# Patient Record
Sex: Male | Born: 1990 | Hispanic: Yes | Marital: Single | Smoking: Never smoker
Health system: Southern US, Community
[De-identification: ages and names within clinical notes are randomized; demographics above are authoritative.]

---

## 2017-05-05 ENCOUNTER — Emergency Department (HOSPITAL_BASED_OUTPATIENT_CLINIC_OR_DEPARTMENT_OTHER)
Admission: EM | Admit: 2017-05-05 | Discharge: 2017-05-05 | Disposition: A | Discharge status: Home / Self Care | Payer: Federal, State, Local not specified - PPO | Attending: Emergency Medicine | Admitting: Emergency Medicine

## 2017-05-05 ENCOUNTER — Emergency Department (HOSPITAL_BASED_OUTPATIENT_CLINIC_OR_DEPARTMENT_OTHER): Payer: Federal, State, Local not specified - PPO

## 2017-05-05 ENCOUNTER — Encounter (HOSPITAL_BASED_OUTPATIENT_CLINIC_OR_DEPARTMENT_OTHER): Payer: Self-pay

## 2017-05-05 DIAGNOSIS — Y929 Unspecified place or not applicable: Secondary | ICD-10-CM | POA: Diagnosis not present

## 2017-05-05 DIAGNOSIS — S99921A Unspecified injury of right foot, initial encounter: Secondary | ICD-10-CM | POA: Diagnosis present

## 2017-05-05 DIAGNOSIS — Y999 Unspecified external cause status: Secondary | ICD-10-CM | POA: Diagnosis not present

## 2017-05-05 DIAGNOSIS — X58XXXA Exposure to other specified factors, initial encounter: Secondary | ICD-10-CM | POA: Diagnosis not present

## 2017-05-05 DIAGNOSIS — S91311A Laceration without foreign body, right foot, initial encounter: Secondary | ICD-10-CM

## 2017-05-05 DIAGNOSIS — R55 Syncope and collapse: Secondary | ICD-10-CM | POA: Diagnosis not present

## 2017-05-05 DIAGNOSIS — Y939 Activity, unspecified: Secondary | ICD-10-CM | POA: Diagnosis not present

## 2017-05-05 DIAGNOSIS — Z23 Encounter for immunization: Secondary | ICD-10-CM | POA: Diagnosis not present

## 2017-05-05 LAB — COMPREHENSIVE METABOLIC PANEL
ALT: 25 U/L (ref 17–63)
ANION GAP: 10 (ref 5–15)
AST: 31 U/L (ref 15–41)
Albumin: 4.2 g/dL (ref 3.5–5.0)
Alkaline Phosphatase: 59 U/L (ref 38–126)
BILIRUBIN TOTAL: 2.2 mg/dL — AB (ref 0.3–1.2)
BUN: 12 mg/dL (ref 6–20)
CHLORIDE: 103 mmol/L (ref 101–111)
CO2: 22 mmol/L (ref 22–32)
Calcium: 9 mg/dL (ref 8.9–10.3)
Creatinine, Ser: 0.88 mg/dL (ref 0.61–1.24)
Glucose, Bld: 193 mg/dL — ABNORMAL HIGH (ref 65–99)
POTASSIUM: 3.3 mmol/L — AB (ref 3.5–5.1)
Sodium: 135 mmol/L (ref 135–145)
TOTAL PROTEIN: 6.7 g/dL (ref 6.5–8.1)

## 2017-05-05 LAB — CBC
HEMATOCRIT: 42.7 % (ref 39.0–52.0)
Hemoglobin: 15.4 g/dL (ref 13.0–17.0)
MCH: 29.5 pg (ref 26.0–34.0)
MCHC: 36.1 g/dL — ABNORMAL HIGH (ref 30.0–36.0)
MCV: 81.8 fL (ref 78.0–100.0)
PLATELETS: 199 10*3/uL (ref 150–400)
RBC: 5.22 MIL/uL (ref 4.22–5.81)
RDW: 12.6 % (ref 11.5–15.5)
WBC: 7.9 10*3/uL (ref 4.0–10.5)

## 2017-05-05 LAB — PROTIME-INR
INR: 1.08
Prothrombin Time: 13.9 seconds (ref 11.4–15.2)

## 2017-05-05 MED ORDER — LIDOCAINE HCL 1 % IJ SOLN
10.0000 mL | Freq: Once | INTRAMUSCULAR | Status: AC
  Administered 2017-05-05: 10 mL via INTRADERMAL

## 2017-05-05 MED ORDER — TETANUS-DIPHTH-ACELL PERTUSSIS 5-2.5-18.5 LF-MCG/0.5 IM SUSP
0.5000 mL | Freq: Once | INTRAMUSCULAR | Status: AC
  Administered 2017-05-05: 0.5 mL via INTRAMUSCULAR
  Filled 2017-05-05: qty 0.5

## 2017-05-05 MED ORDER — LIDOCAINE HCL (PF) 1 % IJ SOLN: 10.0000 mL | Freq: Once | INTRAMUSCULAR | Status: DC

## 2017-05-05 MED ORDER — SODIUM CHLORIDE 0.9 % IV BOLUS (SEPSIS)
1000.0000 mL | Freq: Once | INTRAVENOUS | Status: AC
  Administered 2017-05-05: 1000 mL via INTRAVENOUS

## 2017-05-05 MED ORDER — CLINDAMYCIN HCL 150 MG PO CAPS
450.0000 mg | ORAL_CAPSULE | Freq: Once | ORAL | Status: AC
  Administered 2017-05-05: 450 mg via ORAL
  Filled 2017-05-05: qty 3

## 2017-05-05 MED ORDER — FENTANYL CITRATE (PF) 100 MCG/2ML IJ SOLN
50.0000 ug | Freq: Once | INTRAMUSCULAR | Status: AC
  Administered 2017-05-05: 50 ug via INTRAVENOUS
  Filled 2017-05-05: qty 2

## 2017-05-05 MED ORDER — LIDOCAINE HCL 1 % IJ SOLN
INTRAMUSCULAR | Status: AC
  Administered 2017-05-05: 10 mL via INTRADERMAL
  Filled 2017-05-05: qty 10

## 2017-05-05 MED ORDER — CLINDAMYCIN HCL 150 MG PO CAPS: 450.0000 mg | ORAL_CAPSULE | Freq: Three times a day (TID) | ORAL | 0 refills | Status: AC

## 2017-05-05 NOTE — ED Notes (Signed)
ED Provider at bedside. ([place]) 

## 2017-05-05 NOTE — ED Provider Notes (Signed)
[facility] Provider Note   CSN: [id number] Arrival date & time: 05/05/17  1124     History   Chief Complaint Chief Complaint  Patient presents with  . Extremity Laceration    HPI Geoffrey Cox is a 26 y.o. male.  The history is provided by the patient and a relative. No language interpreter was used.  Laceration   The incident occurred less than 1 hour ago. The laceration is located on the right foot. The laceration is 6 cm in size. The laceration mechanism was a a metal edge. The pain is at a severity of 4/10. The pain is moderate. The pain has been constant since onset. He reports no foreign bodies present. His tetanus status is out of date.    History reviewed. No pertinent past medical history.  There are no active problems to display for this patient.   History reviewed. No pertinent surgical history.     Home Medications    Prior to Admission medications   Not on File    Family History No family history on file.  Social History Social History  Substance Use Topics  . Smoking status: Never Smoker  . Smokeless tobacco: Never Used  . Alcohol use No     Allergies   Penicillins   Review of Systems Review of Systems  Constitutional: Negative for activity change, chills, diaphoresis, fatigue and fever.  HENT: Negative for congestion and rhinorrhea.   Eyes: Negative for visual disturbance.  Respiratory: Negative for cough, chest tightness, shortness of breath, wheezing and stridor.   Cardiovascular: Negative for chest pain and palpitations.  Gastrointestinal: Negative for abdominal pain, nausea and vomiting.  Musculoskeletal: Negative for back pain and gait problem.  Skin: Positive for wound. Negative for rash.  Neurological: Negative for dizziness, weakness, light-headedness and headaches.  Psychiatric/Behavioral: Negative for agitation.  All other systems reviewed and are negative.    Physical Exam Updated Vital Signs BP 114/71    Pulse 67   Temp 98.3 F (36.8 C) (Oral)   Resp 18   Ht 6' (1.829 m)   Wt 81.6 kg (180 lb)   SpO2 100%   BMI 24.41 kg/m   Physical Exam  Constitutional: He is oriented to person, place, and time. He appears well-developed and well-nourished. No distress.  HENT:  Head: Normocephalic.  Mouth/Throat: Oropharynx is clear and moist. No oropharyngeal exudate.  Eyes: Pupils are equal, round, and reactive to light. Conjunctivae are normal.  Neck: Normal range of motion.  Cardiovascular: Regular rhythm and intact distal pulses.   No murmur heard. Pulmonary/Chest: Effort normal. No stridor. No respiratory distress. He exhibits no tenderness.  Abdominal: Soft. He exhibits no distension. There is no tenderness.  Musculoskeletal: He exhibits tenderness.       Right foot: There is tenderness and laceration. There is normal range of motion, no swelling, normal capillary refill and no deformity.       Feet:  6cm laceration on R foot. Normal sensation, cap refill, pulses, and ROM of R foot and ankle. Venous bleeding.   Neurological: He is alert and oriented to person, place, and time. No cranial nerve deficit or sensory deficit. He exhibits normal muscle tone.  Skin: Capillary refill takes less than 2 seconds. He is not diaphoretic. No erythema. No pallor.  Psychiatric: He has a normal mood and affect.  Nursing note and vitals reviewed.    ED Treatments / Results  Labs (all labs ordered are listed, but only abnormal results are displayed) Labs  Reviewed  COMPREHENSIVE METABOLIC PANEL - Abnormal; Notable for the following:       Result Value   Potassium 3.3 (*)    Glucose, Bld 193 (*)    Total Bilirubin 2.2 (*)    All other components within normal limits  CBC - Abnormal; Notable for the following:    MCHC 36.1 (*)    All other components within normal limits  PROTIME-INR    EKG  EKG Interpretation None       Radiology Dg Foot Complete Right  Result Date: 05/05/2017 CLINICAL  DATA:  Laceration on dorsal midfoot. EXAM: RIGHT FOOT COMPLETE - 3+ VIEW COMPARISON:  None. FINDINGS: There is no evidence of fracture or dislocation. There is no evidence of arthropathy or other focal bone abnormality. Soft tissue laceration of the dorsal foot at the level of the mid metatarsals. IMPRESSION: Dorsal forefoot soft tissue laceration. No acute osseous abnormality. Electronically Signed   By: Obie DredgeWilliam T Derry M.D.   On: 05/05/2017 12:25    Procedures .Marland Kitchen.Laceration Repair Date/Time: 05/05/2017 9:12 PM Performed by: Heide ScalesEGELER, Ranya Fiddler J Authorized by: Heide ScalesEGELER, Yosmar Ryker J   Consent:    Consent obtained:  Verbal   Consent given by:  Patient   Risks discussed:  Infection, pain, poor cosmetic result, poor wound healing and need for additional repair   Alternatives discussed:  No treatment Anesthesia (see MAR for exact dosages):    Anesthesia method:  Local infiltration   Local anesthetic:  Lidocaine 1% w/o epi Laceration details:    Location:  Foot   Foot location:  Top of R foot   Length (cm):  6   Depth (mm):  3 Repair type:    Repair type:  Intermediate Pre-procedure details:    Preparation:  Patient was prepped and draped in usual sterile fashion and imaging obtained to evaluate for foreign bodies Exploration:    Hemostasis achieved with:  Direct pressure   Wound exploration: wound explored through full range of motion and entire depth of wound probed and visualized     Wound extent: no foreign bodies/material noted, no muscle damage noted, no nerve damage noted, no tendon damage noted, no underlying fracture noted and no vascular damage noted     Contaminated: no   Treatment:    Area cleansed with:  Saline   Amount of cleaning:  Extensive   Irrigation solution:  Sterile saline   Irrigation volume:  2L   Irrigation method:  Syringe Skin repair:    Repair method:  Sutures   Suture size:  4-0   Suture material:  Prolene   Suture technique:  Horizontal mattress (and  simple interrupted. )   Number of sutures:  14 Approximation:    Approximation:  Close   Vermilion border: well-aligned   Post-procedure details:    Dressing:  Antibiotic ointment and non-adherent dressing   Patient tolerance of procedure:  Tolerated well, no immediate complications   (including critical care time)  Medications Ordered in ED Medications  sodium chloride 0.9 % bolus 1,000 mL (0 mLs Intravenous Stopped 05/05/17 1404)  Tdap (BOOSTRIX) injection 0.5 mL (0.5 mLs Intramuscular Given 05/05/17 1602)  fentaNYL (SUBLIMAZE) injection 50 mcg (50 mcg Intravenous Given 05/05/17 1608)  lidocaine (XYLOCAINE) 1 % (with pres) injection 10 mL (10 mLs Intradermal Given 05/05/17 1602)  clindamycin (CLEOCIN) capsule 450 mg (450 mg Oral Given 05/05/17 1723)     Initial Impression / Assessment and Plan / ED Course  I have reviewed the triage vital signs and the  nursing notes.  Pertinent labs & imaging results that were available during my care of the patient were reviewed by me and considered in my medical decision making (see chart for details).     Geoffrey Cox is a 26 y.o. male With no significant past medical history who presents with laceration. Patient reports that he was trying to move a heavy dresser when he got the leg of it caught on his right foot. He was not wearing shoes at the time. Patient had immediate laceration on right dorsal foot. Laceration approximately 6 cm. Patient had mild to moderate bleeding initially at home and wrapped wound in a towel and used a belt for tourniquet. Patient denied any loss of consciousness, lightheadedness, or other injuries.  On arrival, patient was hypotensive however, patient laid down and blood pressure improved over 100 systolic. Suspect a vagal hypotension in the setting of bleeding and laceration. Patient denies any lightheadedness or fatigue. Patient denies significant bleeding.  Shared conversation held with nursing, emergency team, in charge  and decision made not to activate a leveled trauma at this time due to minimal bleeding, improvement in blood pressure on recheck, and no systemic symptoms of significant traumatic injury.  Patient had traumatic bloodwork ordered showing no evidence of significant abnormality. No anemia. Slight hypokalemia.  No fractures seen on x-ray. No foreign body seen.  Patient was given some fluids and had no other hypotension aside from triage. Continued to suspect a vasovagal hypertension. Laceration was repaired as described above without difficulty. Patient had one covered in bacitracin. Clindamycin was given given the large laceration.  Patient had tetanus updated.  Patient discharged with prescription for clindamycin as he is loved penicillins. Patient will follow up with PCP and observe strict return precautions for infection or worsened pain. Patient has crutches at home and leaves a postop shoe to prevent significant foot flexion. Patient understood plan of care and was discharged in good condition.     Final Clinical Impressions(s) / ED Diagnoses   Final diagnoses:  Laceration of right foot, initial encounter    New Prescriptions Discharge Medication List as of 05/05/2017  5:20 PM    START taking these medications   Details  clindamycin (CLEOCIN) 150 MG capsule Take 3 capsules (450 mg total) by mouth 3 (three) times daily., Starting Thu 05/05/2017, Until Tue 05/10/2017, Print        Clinical Impression: 1. Laceration of right foot, initial encounter     Disposition: Discharge  Condition: Good  I have discussed the results, Dx and Tx plan with the pt(& family if present). He/she/they expressed understanding and agree(s) with the plan. Discharge instructions discussed at great length. Strict return precautions discussed and pt &/or family have verbalized understanding of the instructions. No further questions at time of discharge.    Discharge Medication List as of 05/05/2017  5:20  PM    START taking these medications   Details  clindamycin (CLEOCIN) 150 MG capsule Take 3 capsules (450 mg total) by mouth 3 (three) times daily., Starting Thu 05/05/2017, Until Tue 05/10/2017, Print        Follow Up: [facility] [address] [place] [address] [phone] Schedule an appointment as soon as possible for a visit    [facility] [place] [facility] [address] [phone] mc [address] [place] [address] [phone]  If symptoms worsen     Mikael Debell, Canary Brim, MD 05/05/17 2139

## 2017-05-05 NOTE — ED Notes (Signed)
Pt taken to tx room via w/c to lie down-pt denies feeling light headed-pt presented with leather belt tightened around right LE-removed

## 2017-05-05 NOTE — ED Triage Notes (Signed)
Pt states he cut top of right foot on a mirror just PTA-large lac noted-presents to triage in w/c-pale

## 2017-05-05 NOTE — ED Notes (Signed)
Suture cart at bedside 

## 2017-05-05 NOTE — Discharge Instructions (Signed)
Please take her antibiotics for the next 5 days to prevent infection. Please follow-up with a primary care physician in 7-10 days for suture removal. Please watch for signs and symptoms of infection such as redness, purulence, and streaking. Please use crutches. Please try not to bend the foot to add tension to the sutures. If symptoms change or worsen, please return to the nearest emergency department.

## 2018-08-18 IMAGING — DX DG FOOT COMPLETE 3+V*R*
3 series · 3 of 3 positions shown · non-contrast
Comparison: None.

CLINICAL DATA: Laceration on dorsal midfoot.

EXAM:
RIGHT FOOT COMPLETE - 3+ VIEW

[foot ap]
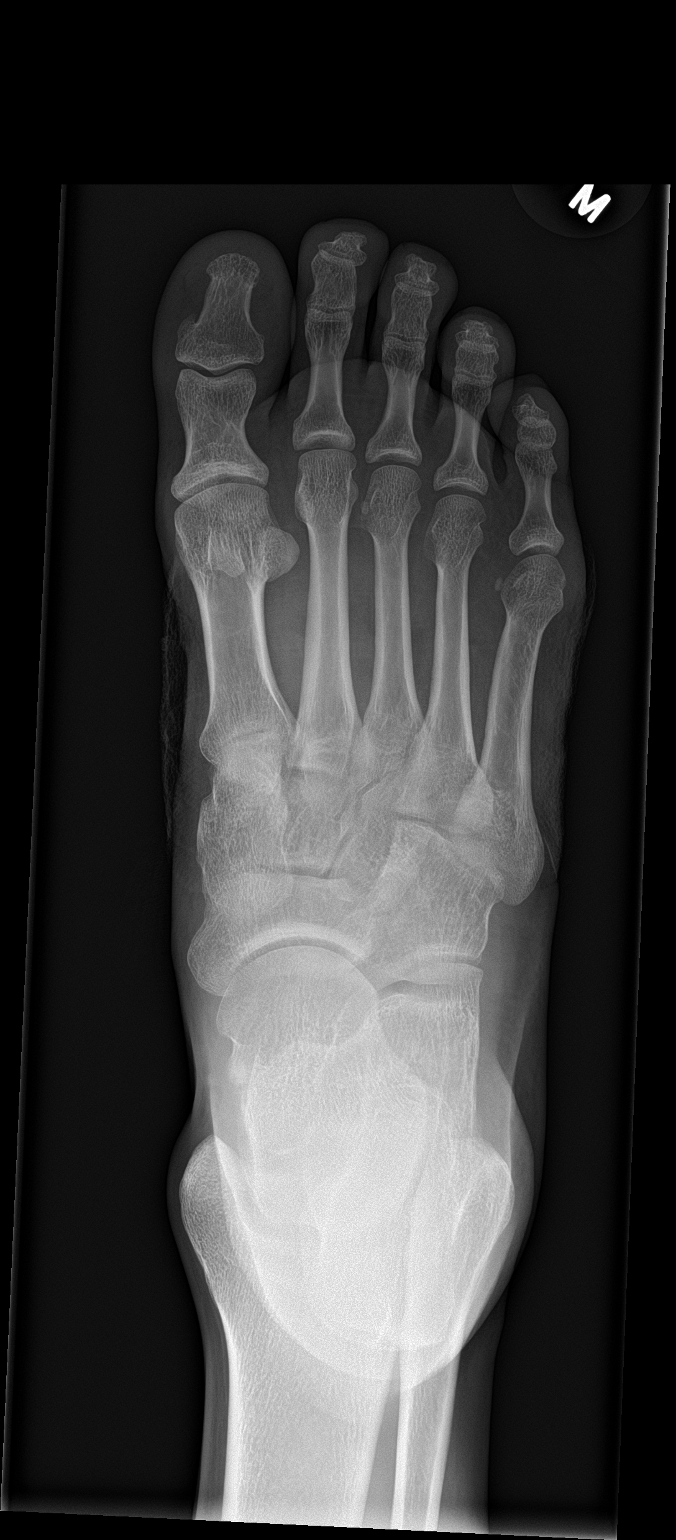

[foot obl]
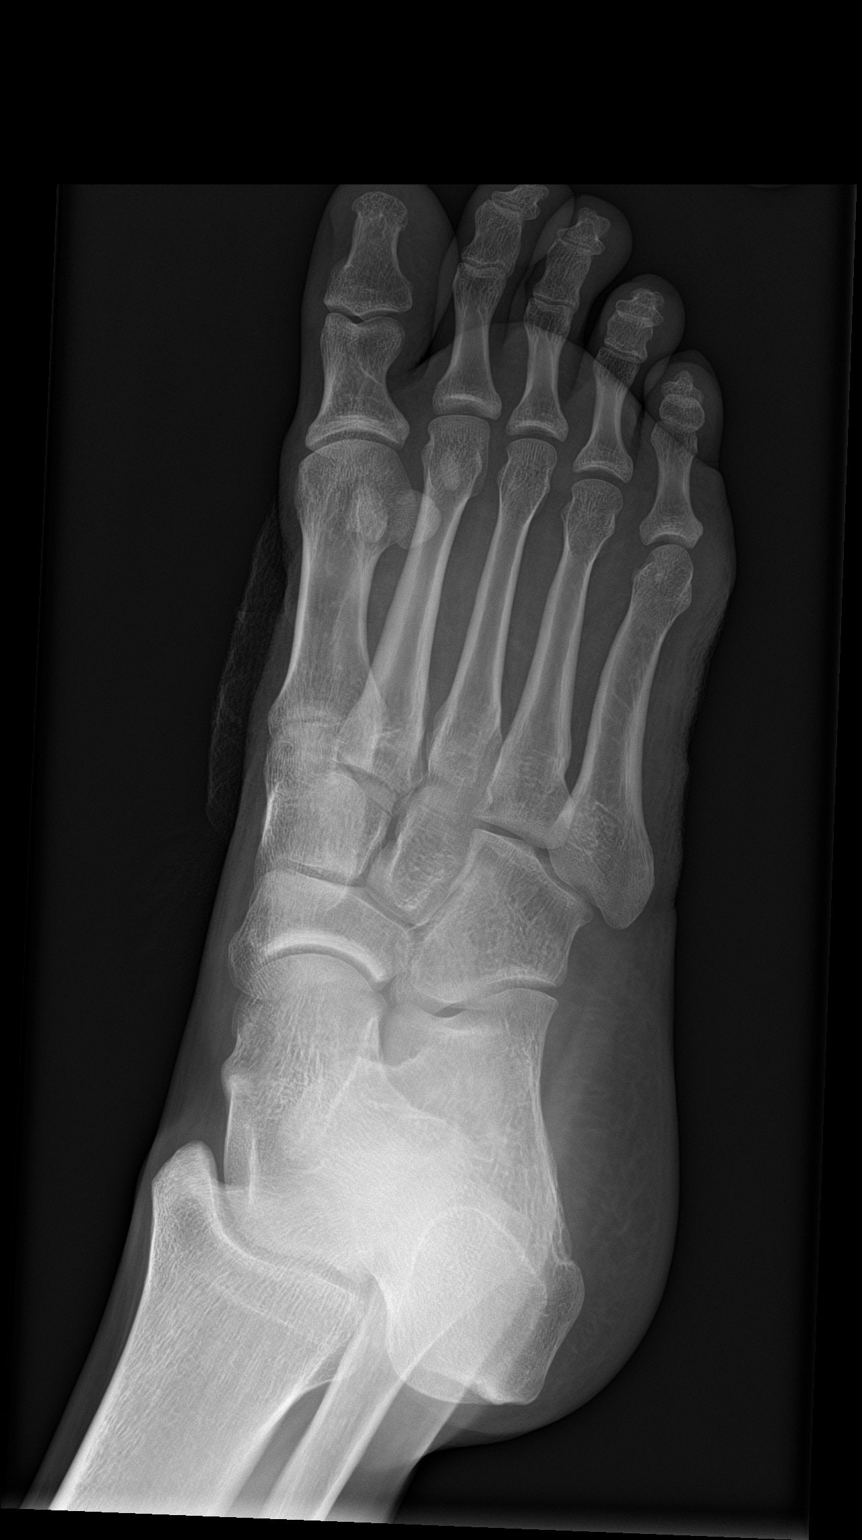

[foot lat]
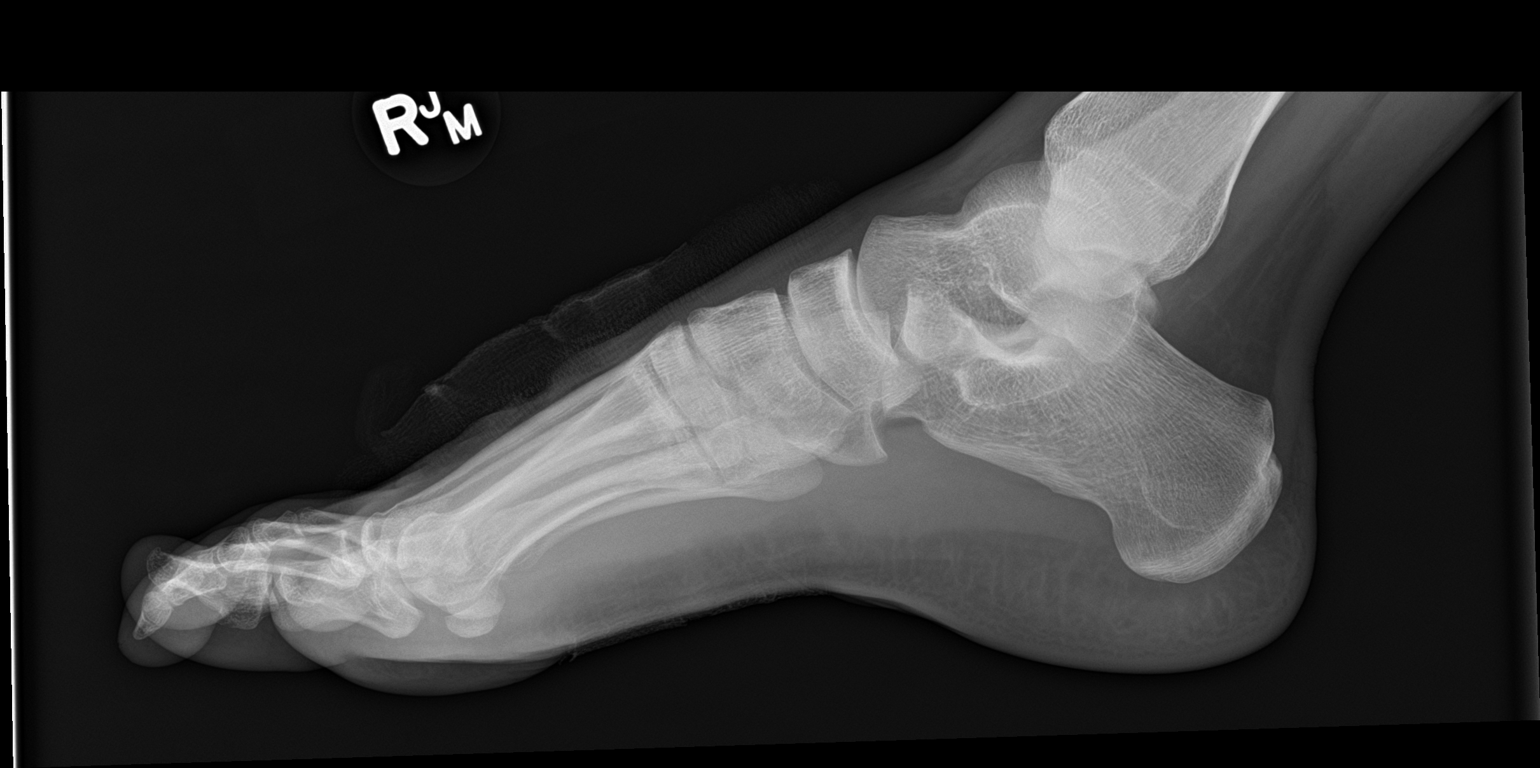

[3 of 3 positions shown; findings below may reference images not displayed]

FINDINGS: There is no evidence of fracture or dislocation. There is no
evidence of arthropathy or other focal bone abnormality. Soft tissue
laceration of the dorsal foot at the level of the mid metatarsals.
IMPRESSION: Dorsal forefoot soft tissue laceration. No acute osseous
abnormality.
